# Patient Record
Sex: Female | Born: 1990 | Race: Black or African American | Hispanic: No | Marital: Single | Smoking: Never smoker
Health system: Southern US, Community
[De-identification: ages and names within clinical notes are randomized; demographics above are authoritative.]

## PROBLEM LIST (undated history)

## (undated) DIAGNOSIS — I10 Essential (primary) hypertension: Secondary | ICD-10-CM

## (undated) DIAGNOSIS — G43909 Migraine, unspecified, not intractable, without status migrainosus: Secondary | ICD-10-CM

## (undated) HISTORY — PX: ECTOPIC PREGNANCY SURGERY: SHX613

## (undated) HISTORY — PX: APPENDECTOMY: SHX54

---

## 2019-06-15 ENCOUNTER — Other Ambulatory Visit: Payer: Self-pay

## 2019-06-15 ENCOUNTER — Emergency Department (HOSPITAL_COMMUNITY)
Admission: EM | Admit: 2019-06-15 | Discharge: 2019-06-16 | Disposition: A | Discharge status: Home / Self Care | Payer: Self-pay | Attending: Emergency Medicine | Admitting: Emergency Medicine

## 2019-06-15 ENCOUNTER — Encounter (HOSPITAL_COMMUNITY): Payer: Self-pay | Admitting: Emergency Medicine

## 2019-06-15 DIAGNOSIS — R0602 Shortness of breath: Secondary | ICD-10-CM | POA: Insufficient documentation

## 2019-06-15 DIAGNOSIS — I10 Essential (primary) hypertension: Secondary | ICD-10-CM | POA: Insufficient documentation

## 2019-06-15 DIAGNOSIS — Z79899 Other long term (current) drug therapy: Secondary | ICD-10-CM | POA: Insufficient documentation

## 2019-06-15 DIAGNOSIS — R112 Nausea with vomiting, unspecified: Secondary | ICD-10-CM | POA: Insufficient documentation

## 2019-06-15 DIAGNOSIS — E860 Dehydration: Secondary | ICD-10-CM | POA: Insufficient documentation

## 2019-06-15 DIAGNOSIS — R109 Unspecified abdominal pain: Secondary | ICD-10-CM | POA: Insufficient documentation

## 2019-06-15 HISTORY — DX: Migraine, unspecified, not intractable, without status migrainosus: G43.909

## 2019-06-15 HISTORY — DX: Essential (primary) hypertension: I10

## 2019-06-15 NOTE — ED Triage Notes (Signed)
Pt reports traveling to [place] last weekend. Pt states on Tuesday she began to have emesis, diarrhea, and chills. Denies fevers at home.

## 2019-06-16 ENCOUNTER — Emergency Department (HOSPITAL_COMMUNITY): Payer: Self-pay

## 2019-06-16 LAB — COMPREHENSIVE METABOLIC PANEL
ALT: 17 U/L (ref 0–44)
AST: 17 U/L (ref 15–41)
Albumin: 4.2 g/dL (ref 3.5–5.0)
Alkaline Phosphatase: 45 U/L (ref 38–126)
Anion gap: 9 (ref 5–15)
BUN: 8 mg/dL (ref 6–20)
CO2: 25 mmol/L (ref 22–32)
Calcium: 9.3 mg/dL (ref 8.9–10.3)
Chloride: 105 mmol/L (ref 98–111)
Creatinine, Ser: 0.88 mg/dL (ref 0.44–1.00)
GFR calc Af Amer: >60 mL/min (ref >60)
GFR calc non Af Amer: >60 mL/min (ref >60)
Glucose, Bld: 89 mg/dL (ref 70–99)
Potassium: 3.8 mmol/L (ref 3.5–5.1)
Sodium: 139 mmol/L (ref 135–145)
Total Bilirubin: 0.5 mg/dL (ref 0.3–1.2)
Total Protein: 8.2 g/dL — ABNORMAL HIGH (ref 6.5–8.1)

## 2019-06-16 LAB — CBC WITH DIFFERENTIAL/PLATELET
Abs Immature Granulocytes: 0.01 10*3/uL (ref 0.00–0.07)
Basophils Absolute: 0.1 10*3/uL (ref 0.0–0.1)
Basophils Relative: 1 %
Eosinophils Absolute: 0.2 10*3/uL (ref 0.0–0.5)
Eosinophils Relative: 2 %
HCT: 43.6 % (ref 36.0–46.0)
Hemoglobin: 13.6 g/dL (ref 12.0–15.0)
Immature Granulocytes: 0 %
Lymphocytes Relative: 34 %
Lymphs Abs: 3.5 10*3/uL (ref 0.7–4.0)
MCH: 28.9 pg (ref 26.0–34.0)
MCHC: 31.2 g/dL (ref 30.0–36.0)
MCV: 92.6 fL (ref 80.0–100.0)
Monocytes Absolute: 0.7 10*3/uL (ref 0.1–1.0)
Monocytes Relative: 6 %
Neutro Abs: 6 10*3/uL (ref 1.7–7.7)
Neutrophils Relative %: 57 %
Platelets: 335 10*3/uL (ref 150–400)
RBC: 4.71 MIL/uL (ref 3.87–5.11)
RDW: 13.9 % (ref 11.5–15.5)
WBC: 10.4 10*3/uL (ref 4.0–10.5)
nRBC: 0 % (ref 0.0–0.2)

## 2019-06-16 LAB — D-DIMER, QUANTITATIVE: D-Dimer, Quant: 0.64 ug/mL-FEU — ABNORMAL HIGH (ref 0.00–0.50)

## 2019-06-16 LAB — LIPASE, BLOOD: Lipase: 28 U/L (ref 11–51)

## 2019-06-16 LAB — I-STAT BETA HCG BLOOD, ED (MC, WL, AP ONLY): I-stat hCG, quantitative: <5 m[IU]/mL (ref <5)

## 2019-06-16 LAB — LACTIC ACID, PLASMA: Lactic Acid, Venous: 1.1 mmol/L (ref 0.5–1.9)

## 2019-06-16 MED ORDER — ONDANSETRON HCL 4 MG PO TABS: 4.0000 mg | ORAL_TABLET | Freq: Three times a day (TID) | ORAL | 0 refills | Status: AC | PRN

## 2019-06-16 MED ORDER — DIPHENHYDRAMINE HCL 50 MG/ML IJ SOLN: 25.0000 mg | Freq: Once | INTRAMUSCULAR | Status: AC

## 2019-06-16 MED ORDER — IOHEXOL 350 MG/ML SOLN
100.0000 mL | Freq: Once | INTRAVENOUS | Status: AC | PRN
  Administered 2019-06-16: 100 mL via INTRAVENOUS

## 2019-06-16 MED ORDER — SODIUM CHLORIDE 0.9 % IV BOLUS
1000.0000 mL | Freq: Once | INTRAVENOUS | Status: AC
  Administered 2019-06-16: 1000 mL via INTRAVENOUS

## 2019-06-16 MED ORDER — FENTANYL CITRATE (PF) 100 MCG/2ML IJ SOLN
50.0000 ug | Freq: Once | INTRAMUSCULAR | Status: AC
  Administered 2019-06-16: 50 ug via INTRAVENOUS
  Filled 2019-06-16: qty 2

## 2019-06-16 MED ORDER — METOCLOPRAMIDE HCL 5 MG/ML IJ SOLN: 10.0000 mg | Freq: Once | INTRAMUSCULAR | Status: AC

## 2019-06-16 MED ORDER — LOPERAMIDE HCL 2 MG PO CAPS: 4.0000 mg | ORAL_CAPSULE | Freq: Once | ORAL | Status: AC

## 2019-06-16 MED ORDER — TRAMADOL HCL 50 MG PO TABS: 100.0000 mg | ORAL_TABLET | Freq: Four times a day (QID) | ORAL | 0 refills | Status: AC | PRN

## 2019-06-16 MED ORDER — LOPERAMIDE HCL 2 MG PO CAPS
ORAL_CAPSULE | ORAL | Status: AC
  Administered 2019-06-16: 4 mg via ORAL
  Filled 2019-06-16: qty 2

## 2019-06-16 MED ORDER — METOCLOPRAMIDE HCL 5 MG/ML IJ SOLN
INTRAMUSCULAR | Status: AC
  Administered 2019-06-16: 10 mg via INTRAVENOUS
  Filled 2019-06-16: qty 2

## 2019-06-16 MED ORDER — CIPROFLOXACIN HCL 500 MG PO TABS
500.0000 mg | ORAL_TABLET | Freq: Two times a day (BID) | ORAL | 0 refills | Status: AC
Start: 2019-06-16 — End: ?

## 2019-06-16 MED ORDER — IOHEXOL 300 MG/ML  SOLN
100.0000 mL | Freq: Once | INTRAMUSCULAR | Status: AC | PRN
  Administered 2019-06-16: 100 mL via INTRAVENOUS

## 2019-06-16 MED ORDER — ONDANSETRON HCL 4 MG/2ML IJ SOLN
4.0000 mg | Freq: Once | INTRAMUSCULAR | Status: AC
  Administered 2019-06-16: 4 mg via INTRAVENOUS
  Filled 2019-06-16: qty 2

## 2019-06-16 MED ORDER — METRONIDAZOLE 500 MG PO TABS: 500.0000 mg | ORAL_TABLET | Freq: Three times a day (TID) | ORAL | 0 refills | Status: AC

## 2019-06-16 MED ORDER — DIPHENHYDRAMINE HCL 50 MG/ML IJ SOLN
INTRAMUSCULAR | Status: AC
  Administered 2019-06-16: 25 mg via INTRAVENOUS
  Filled 2019-06-16: qty 1

## 2019-06-16 MED ORDER — METRONIDAZOLE IN NACL 5-0.79 MG/ML-% IV SOLN
500.0000 mg | Freq: Once | INTRAVENOUS | Status: AC
  Administered 2019-06-16: 500 mg via INTRAVENOUS
  Filled 2019-06-16: qty 100

## 2019-06-16 MED ORDER — ONDANSETRON HCL 4 MG PO TABS: 4.0000 mg | ORAL_TABLET | Freq: Four times a day (QID) | ORAL | 0 refills | Status: AC

## 2019-06-16 MED ORDER — CIPROFLOXACIN IN D5W 400 MG/200ML IV SOLN
400.0000 mg | Freq: Once | INTRAVENOUS | Status: AC
  Administered 2019-06-16: 400 mg via INTRAVENOUS
  Filled 2019-06-16: qty 200

## 2019-06-16 NOTE — Discharge Instructions (Addendum)
Drink plenty of fluids (clear liquids) then start a bland diet later this morning such as toast, crackers, jello, [employer] chicken noodle soup. Use the zofran for nausea or vomiting. Take imodium OTC for diarrhea. Avoid milk products until the diarrhea is gone.  Take the tramadol with acetaminophen 650 mg 4 times a day for pain.  Take the antibiotics until gone.  Recheck if you get worse again.

## 2019-06-16 NOTE — ED Provider Notes (Signed)
[facility] EMERGENCY DEPARTMENT Provider Note   CSN: [id number] Arrival date & time: 06/15/19  2310   Time seen 12:15 AM  History Chief Complaint  Patient presents with  . Emesis    Cathy Carr is a 29 y.o. female.  HPI   Patient states she moved here from [place] about 1 month ago.  They went back over the weekend, June 4 and returned on Monday evening June 7.  She states they drove.  She states June 8 she started getting nausea and vomiting and diarrhea.  She states she is vomiting about 3-4 times a day and has been without bleeding until tonight about 45 minutes prior to arrival where she saw some streaks of blood.  She describes a lot of dry heaving.  She states she is having 4-5 episodes of watery diarrhea.  She has diffuse abdominal pain that gets worse right before she has to vomit or have diarrhea.  She complains of myalgias and chills but has had no known fever.  She feels dizzy and lightheaded on standing since this morning.  She denies having a dry tongue.  She states her chest feels tight and she feels short of breath. She denies history of diabetes.  She states she is never felt this way before.  She states when she called home she found out her grandmother, her aunt, and her son are also sick.  Her fianc and older daughter are not sick.  Patient states she had the Moderna vaccine in April.  PCP Patient, No Pcp Per   Past Medical History:  Diagnosis Date  . Hypertension   . Migraine     There are no problems to display for this patient.   Past Surgical History:  Procedure Laterality Date  . APPENDECTOMY    . ECTOPIC PREGNANCY SURGERY       OB History   No obstetric history on file.     No family history on file.  Social History   Tobacco Use  . Smoking status: Never Smoker  . Smokeless tobacco: Never Used  Substance Use Topics  . Alcohol use: Not Currently  . Drug use: Not Currently    Home Medications Prior to Admission medications     Medication Sig Start Date End Date Taking? Authorizing Provider  ciprofloxacin (CIPRO) 500 MG tablet Take 1 tablet (500 mg total) by mouth 2 (two) times daily. 06/16/19   Devoria Albe, MD  metroNIDAZOLE (FLAGYL) 500 MG tablet Take 1 tablet (500 mg total) by mouth 3 (three) times daily. 06/16/19   Devoria Albe, MD  ondansetron (ZOFRAN) 4 MG tablet Take 1 tablet (4 mg total) by mouth every 8 (eight) hours as needed. 06/16/19   Devoria Albe, MD  ondansetron (ZOFRAN) 4 MG tablet Take 1 tablet (4 mg total) by mouth every 6 (six) hours. 06/16/19   Devoria Albe, MD  traMADol (ULTRAM) 50 MG tablet Take 2 tablets (100 mg total) by mouth every 6 (six) hours as needed for moderate pain or severe pain. 06/16/19   Devoria Albe, MD    Allergies    Patient has no allergy information on record.  Review of Systems   Review of Systems  All other systems reviewed and are negative.   Physical Exam Updated Vital Signs BP 124/71 (BP Location: Right Arm)   Pulse 77   Temp 98.6 F (37 C) (Oral)   Resp 15   Ht 5\' 2"  (1.575 m)   Wt 79.4 kg   SpO2 100%  BMI 32.01 kg/m   Physical Exam Vitals and nursing note reviewed.  Constitutional:      General: She is in acute distress.     Appearance: Normal appearance. She is normal weight.  HENT:     Head: Normocephalic and atraumatic.     Right Ear: External ear normal.     Left Ear: External ear normal.     Nose: Nose normal.     Mouth/Throat:     Mouth: Mucous membranes are dry.  Eyes:     Extraocular Movements: Extraocular movements intact.     Conjunctiva/sclera: Conjunctivae normal.     Pupils: Pupils are equal, round, and reactive to light.  Cardiovascular:     Rate and Rhythm: Normal rate and regular rhythm.  Pulmonary:     Effort: Respiratory distress present.     Breath sounds: Normal breath sounds. No stridor. No wheezing, rhonchi or rales.  Abdominal:     General: Bowel sounds are normal.     Palpations: Abdomen is soft.     Tenderness: There is  abdominal tenderness.     Comments: Patient's tenderness appears to be mainly on the left side and the worst is in the left lower quadrant followed by the left upper quadrant.  Musculoskeletal:        General: Normal range of motion.     Cervical back: Normal range of motion.  Skin:    General: Skin is warm and dry.  Neurological:     General: No focal deficit present.     Mental Status: She is alert and oriented to person, place, and time.     Cranial Nerves: No cranial nerve deficit.  Psychiatric:        Mood and Affect: Mood is anxious.        Speech: Speech is rapid and pressured.        Behavior: Behavior is cooperative.     ED Results / Procedures / Treatments   Labs (all labs ordered are listed, but only abnormal results are displayed) Results for orders placed or performed during the hospital encounter of 06/15/19  Comprehensive metabolic panel  Result Value Ref Range   Sodium 139 135 - 145 mmol/L   Potassium 3.8 3.5 - 5.1 mmol/L   Chloride 105 98 - 111 mmol/L   CO2 25 22 - 32 mmol/L   Glucose, Bld 89 70 - 99 mg/dL   BUN 8 6 - 20 mg/dL   Creatinine, Ser 7.820.88 0.44 - 1.00 mg/dL   Calcium 9.3 8.9 - 95.610.3 mg/dL   Total Protein 8.2 (H) 6.5 - 8.1 g/dL   Albumin 4.2 3.5 - 5.0 g/dL   AST 17 15 - 41 U/L   ALT 17 0 - 44 U/L   Alkaline Phosphatase 45 38 - 126 U/L   Total Bilirubin 0.5 0.3 - 1.2 mg/dL   GFR calc non Af Amer >60 >60 mL/min   GFR calc Af Amer >60 >60 mL/min   Anion gap 9 5 - 15  Lipase, blood  Result Value Ref Range   Lipase 28 11 - 51 U/L  CBC with Differential  Result Value Ref Range   WBC 10.4 4.0 - 10.5 K/uL   RBC 4.71 3.87 - 5.11 MIL/uL   Hemoglobin 13.6 12.0 - 15.0 g/dL   HCT 21.343.6 36 - 46 %   MCV 92.6 80.0 - 100.0 fL   MCH 28.9 26.0 - 34.0 pg   MCHC 31.2 30.0 - 36.0 g/dL   RDW  13.9 11.5 - 15.5 %   Platelets 335 150 - 400 K/uL   nRBC 0.0 0.0 - 0.2 %   Neutrophils Relative % 57 %   Neutro Abs 6.0 1.7 - 7.7 K/uL   Lymphocytes Relative 34 %    Lymphs Abs 3.5 0.7 - 4.0 K/uL   Monocytes Relative 6 %   Monocytes Absolute 0.7 0 - 1 K/uL   Eosinophils Relative 2 %   Eosinophils Absolute 0.2 0 - 0 K/uL   Basophils Relative 1 %   Basophils Absolute 0.1 0 - 0 K/uL   Immature Granulocytes 0 %   Abs Immature Granulocytes 0.01 0.00 - 0.07 K/uL  Lactic acid, plasma  Result Value Ref Range   Lactic Acid, Venous 1.1 0.5 - 1.9 mmol/L  D-dimer, quantitative  Result Value Ref Range   D-Dimer, Quant 0.64 (H) 0.00 - 0.50 ug/mL-FEU  I-Stat Beta hCG blood, ED ([facility], [facility], [facility] only)  Result Value Ref Range   I-stat hCG, quantitative <5.0 <5 mIU/mL   Comment 3           Laboratory interpretation all normal except mildly elevated D-dimer    EKG None  Radiology CT Angio Chest PE W/Cm &/Or Wo Cm  Result Date: 06/16/2019 CLINICAL DATA:  Shortness of breath, elevated D-dimer, recent prolonged travel EXAM: CT ANGIOGRAPHY CHEST WITH CONTRAST TECHNIQUE: Multidetector CT imaging of the chest was performed using the standard protocol during bolus administration of intravenous contrast. Multiplanar CT image reconstructions and MIPs were obtained to evaluate the vascular anatomy. CONTRAST:  144mL OMNIPAQUE IOHEXOL 300 MG/ML SOLN, 170mL OMNIPAQUE IOHEXOL 350 MG/ML SOLN COMPARISON:  Same-day CT abdomen and pelvis. FINDINGS: Cardiovascular: Satisfactory opacification the pulmonary arteries to the segmental level. No pulmonary artery filling defects are identified. Central pulmonary arteries are normal caliber. Mild reflux of contrast into the IVC and hepatic veins. Normal heart size. No pericardial effusion. The aortic root is suboptimally assessed given cardiac pulsation artifact. The aorta is normal caliber. No acute luminal abnormality, periaortic stranding or hemorrhage. Shared origin of the brachiocephalic and left common carotid artery. Great vessels are otherwise unremarkable. Mediastinum/Nodes: Wedge-shaped soft tissue attenuation in the anterior mediastinum.  No mediastinal fluid or gas. Normal thyroid gland and thoracic inlet. No acute abnormality of the trachea or esophagus. No worrisome mediastinal, hilar or axillary adenopathy. Lungs/Pleura: Low volumes and atelectatic changes likely accentuated by imaging during exhalation angiographic technique. No consolidation, features of edema, pneumothorax, or effusion. No suspicious pulmonary nodules or masses. Upper Abdomen: Punctate gallstone within the otherwise normal gallbladder. Scattered colonic diverticula without focal pericolonic inflammation to suggest diverticulitis. No other acute abnormalities present in the visualized portions of the upper abdomen. Musculoskeletal: No chest wall abnormality. No acute or significant osseous findings. Review of the MIP images confirms the above findings. IMPRESSION: 1. No acute pulmonary artery filling defects to suggest pulmonary embolism. 2. Reflux of contrast in the IVC and hepatic veins is nonspecific but can be seen with elevated right heart pressures. 3. Low volumes and atelectatic changes likely accentuated by imaging during exhalation angiographic technique. No other acute intrathoracic process. 4. Wedge-shaped soft tissue attenuation in the anterior mediastinum, likely representing a thymic remnant in a patient of this age. 5. Cholelithiasis without evidence of acute cholecystitis. 6. Colonic diverticulosis without evidence of acute diverticulitis. Electronically Signed   By: Lovena Le M.D.   On: 06/16/2019 05:51   CT Abdomen Pelvis W Contrast  Result Date: 06/16/2019 CLINICAL DATA:  Diverticulitis suspected EXAM: CT ABDOMEN AND  PELVIS WITH CONTRAST TECHNIQUE: Multidetector CT imaging of the abdomen and pelvis was performed using the standard protocol following bolus administration of intravenous contrast. CONTRAST:  OMNIPAQUE IOHEXOL 300 MG/ML  SOLN COMPARISON:  None. FINDINGS: Lower chest: Lung bases are clear. Normal heart size. No pericardial effusion.  Hepatobiliary: No worrisome focal liver lesions. Smooth liver surface contour. Normal hepatic attenuation. Small punctate gallstone within the gallbladder. No pericholecystic fluid or inflammation. No calcified intraductal gallstones or biliary dilatation. Pancreas: Unremarkable. No pancreatic ductal dilatation or surrounding inflammatory changes. Spleen: Normal in size without focal abnormality. Adrenals/Urinary Tract: Normal adrenal glands. Kidneys are normally located with symmetric enhancement and excretion. No suspicious renal lesion, urolithiasis or hydronephrosis. Urinary bladder is unremarkable. Stomach/Bowel: Distal esophagus, stomach and duodenal sweep are unremarkable. No small bowel wall thickening or dilatation. No evidence of obstruction. The appendix is surgically absent. There may be mild descending colonic mural thickening versus underdistention. Scattered colonic diverticula without focal pericolonic inflammation to suggest diverticulitis. Vascular/Lymphatic: No significant vascular findings are present. No enlarged abdominal or pelvic lymph nodes. Reproductive: Anteverted uterus.  No concerning adnexal lesions. Other: No abdominopelvic free fluid or free gas. No bowel containing hernias. Musculoskeletal: No acute osseous abnormality or suspicious osseous lesion. IMPRESSION: 1. Mild descending colonic mural thickening versus underdistention. Correlate for symptoms of colitis. 2. Colonic diverticulosis without convincing features of diverticulitis. 3. Cholelithiasis without evidence of acute cholecystitis. 4. Prior appendectomy. Electronically Signed   By: Kreg Shropshire M.D.   On: 06/16/2019 03:06    Procedures Procedures (including critical care time)  Medications Ordered in ED Medications  sodium chloride 0.9 % bolus 1,000 mL (0 mLs Intravenous Stopped 06/16/19 0211)  ondansetron (ZOFRAN) injection 4 mg (4 mg Intravenous Given 06/16/19 0035)  fentaNYL (SUBLIMAZE) injection 50 mcg (50 mcg  Intravenous Given 06/16/19 0035)  diphenhydrAMINE (BENADRYL) injection 25 mg (25 mg Intravenous Given 06/16/19 0212)  metoCLOPramide (REGLAN) injection 10 mg (10 mg Intravenous Given 06/16/19 0212)  loperamide (IMODIUM) capsule 4 mg (4 mg Oral Given 06/16/19 0213)  sodium chloride 0.9 % bolus 1,000 mL (0 mLs Intravenous Stopped 06/16/19 0406)  iohexol (OMNIPAQUE) 300 MG/ML solution 100 mL (100 mLs Intravenous Contrast Given 06/16/19 0234)  metroNIDAZOLE (FLAGYL) IVPB 500 mg (0 mg Intravenous Stopped 06/16/19 4098)  ciprofloxacin (CIPRO) IVPB 400 mg (0 mg Intravenous Stopped 06/16/19 0609)  iohexol (OMNIPAQUE) 350 MG/ML injection 100 mL (100 mLs Intravenous Contrast Given 06/16/19 0510)    ED Course  I have reviewed the triage vital signs and the nursing notes.  Pertinent labs & imaging results that were available during my care of the patient were reviewed by me and considered in my medical decision making (see chart for details).    MDM Rules/Calculators/A&P                        01:20 AM still seems stressed, now c/o HA after fentanyl "rush", states had same when delivered her baby. Given bendaryl 25 mg IV, reglan 10 mg IV, Imodium 4 mg po and another liter NS bolus  2:00 AM when I went to see patient she states she is feeling worse, when asked her what is wrong she states she is having a headache.  She states that they "post fentanyl head rush".  She states she had it before when she was given medication when she was delivering her baby.  She was given Reglan and Benadryl.  Recheck at 4:00 AM patient states she has had very minimal  diarrhea since she got medication in the ED.  She states she still feels short of breath but it is better.  We discussed that her D-dimer was minimally elevated and with her recent travel concern is for PE.  CTA was ordered.  We also discussed her CT abdomen results which did show gallstones and diverticulosis.  Possibly some colitis.  She was started on Cipro and  Flagyl.  6:20 AM patient was given results of her CT scan which was negative for pulmonary embolus or infiltrate.  Patient states she has had minimal diarrhea since she has been here and her nausea is better.  She states she still feels short of breath.  At this point I am not sure the etiology of her shortness of breath but her scans are reassuring.  I am not convinced she has colitis however I will discharge her home with Flagyl and Cipro in case she does have some mild inflammation of her colon.  Final Clinical Impression(s) / ED Diagnoses Final diagnoses:  Nausea vomiting and diarrhea  Dehydration  Shortness of breath    Rx / DC Orders ED Discharge Orders         Ordered    ciprofloxacin (CIPRO) 500 MG tablet  2 times daily     Discontinue  Reprint     06/16/19 0629    metroNIDAZOLE (FLAGYL) 500 MG tablet  3 times daily     Discontinue  Reprint     06/16/19 0629    ondansetron (ZOFRAN) 4 MG tablet  Every 8 hours PRN     Discontinue  Reprint     06/16/19 0629    ondansetron (ZOFRAN) 4 MG tablet  Every 6 hours     Discontinue  Reprint     06/16/19 0629    traMADol (ULTRAM) 50 MG tablet  Every 6 hours PRN     Discontinue  Reprint     06/16/19 0629         Plan discharge  Devoria Albe, MD, Concha Pyo, MD 06/16/19 0630

## 2019-08-16 ENCOUNTER — Other Ambulatory Visit (HOSPITAL_COMMUNITY)
Admission: RE | Admit: 2019-08-16 | Discharge: 2019-08-16 | Disposition: A | Discharge status: Home / Self Care | Payer: Self-pay | Source: Ambulatory Visit | Attending: Obstetrics & Gynecology | Admitting: Obstetrics & Gynecology

## 2019-08-16 DIAGNOSIS — Z01419 Encounter for gynecological examination (general) (routine) without abnormal findings: Secondary | ICD-10-CM | POA: Insufficient documentation

## 2020-04-06 DIAGNOSIS — Z419 Encounter for procedure for purposes other than remedying health state, unspecified: Secondary | ICD-10-CM | POA: Diagnosis not present

## 2020-05-06 DIAGNOSIS — Z419 Encounter for procedure for purposes other than remedying health state, unspecified: Secondary | ICD-10-CM | POA: Diagnosis not present

## 2020-06-06 DIAGNOSIS — Z419 Encounter for procedure for purposes other than remedying health state, unspecified: Secondary | ICD-10-CM | POA: Diagnosis not present

## 2020-07-06 DIAGNOSIS — Z419 Encounter for procedure for purposes other than remedying health state, unspecified: Secondary | ICD-10-CM | POA: Diagnosis not present

## 2020-12-24 IMAGING — CT CT ABD-PELV W/ CM
2 of 4 series · 16 of 46 positions shown, 18 images · IV contrast (Omnipaque or Isovue)
Comparison: None.

CLINICAL DATA: Diverticulitis suspected

EXAM:
CT ABDOMEN AND PELVIS WITH CONTRAST
TECHNIQUE: Multidetector CT imaging of the abdomen and pelvis was performed
using the standard protocol following bolus administration of
intravenous contrast.
CONTRAST:  100mL OMNIPAQUE IOHEXOL 300 MG/ML  SOLN

[Series 2: axial st · axial · 0.81mm/px · z∈[+599,+1034]mm · 13 of 95 slices shown, 15 images]
[im 4/95  soft-tissue]
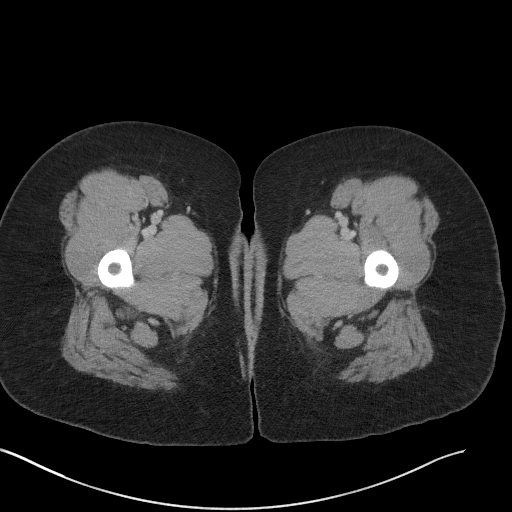
[im 4/95  bone]
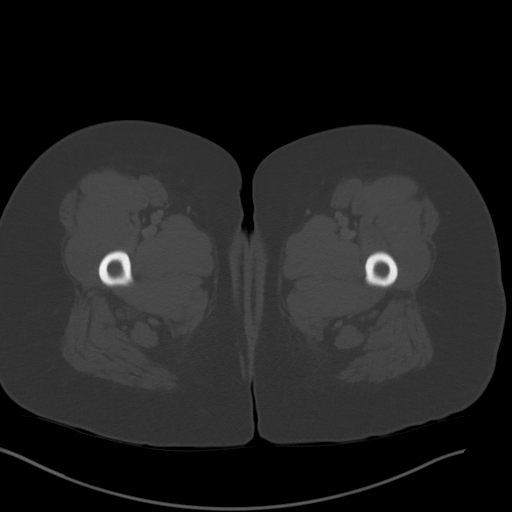
[im 12/95  soft-tissue]
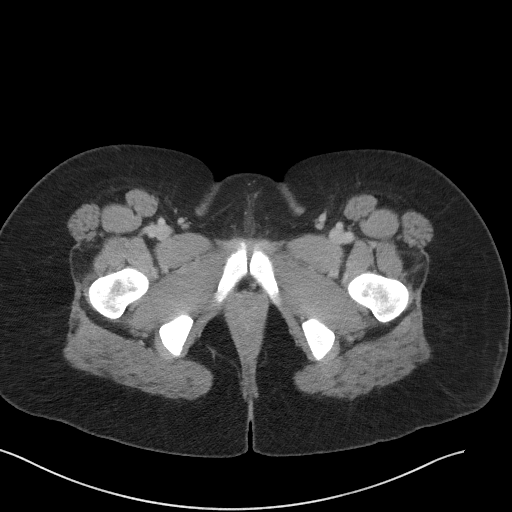
[im 19/95  soft-tissue]
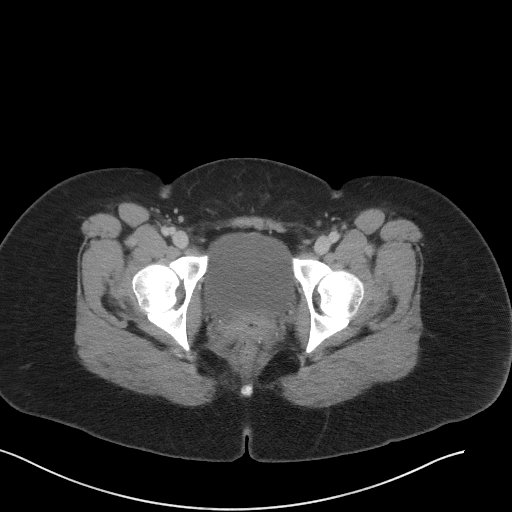
[im 27/95  soft-tissue]
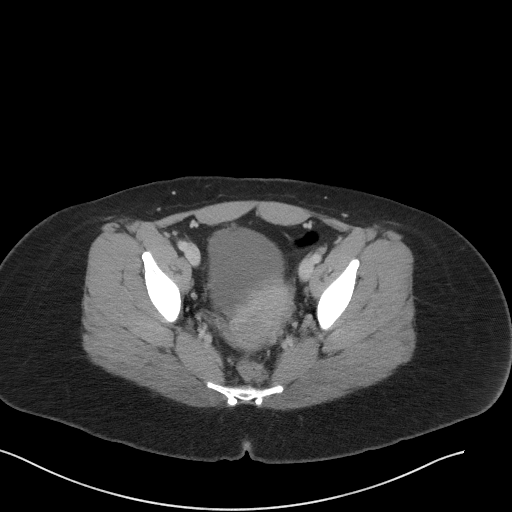
[im 34/95  soft-tissue]
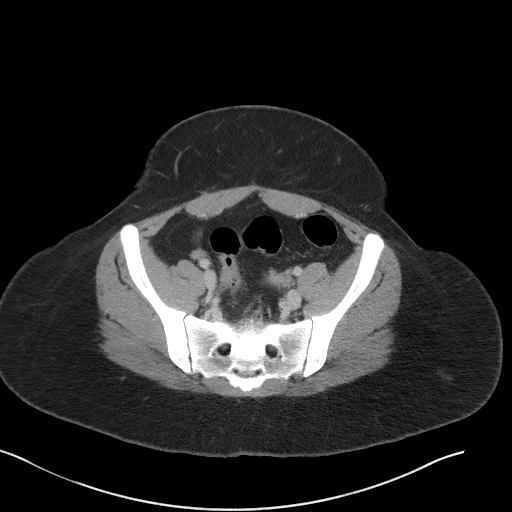
[im 42/95  soft-tissue]
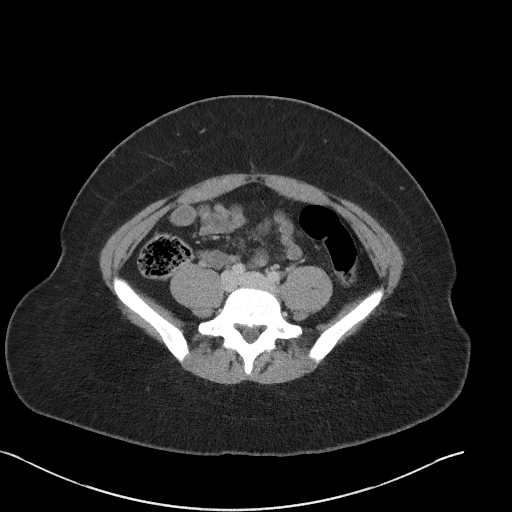
[im 49/95  soft-tissue]
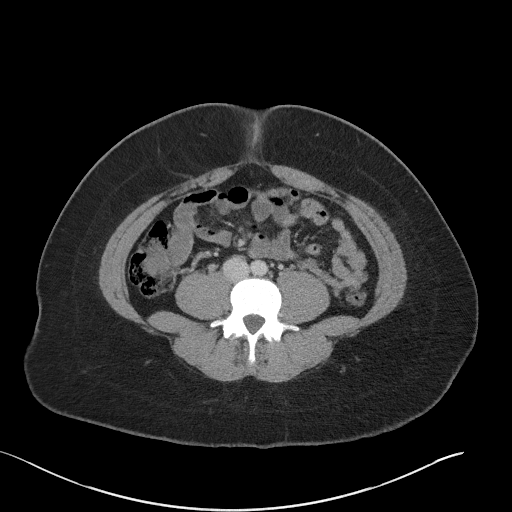
[im 53/95  soft-tissue]
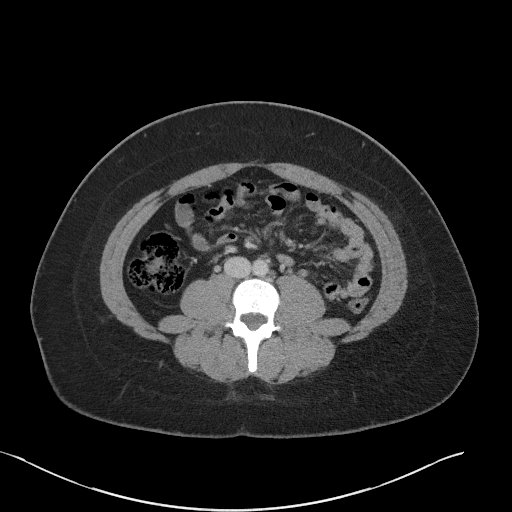
[im 61/95  soft-tissue]
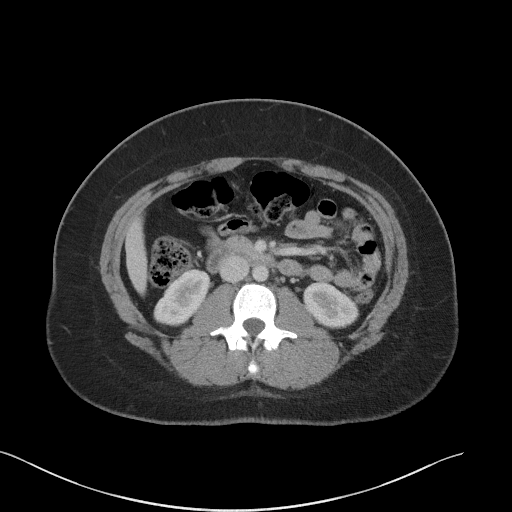
[im 61/95  bone]
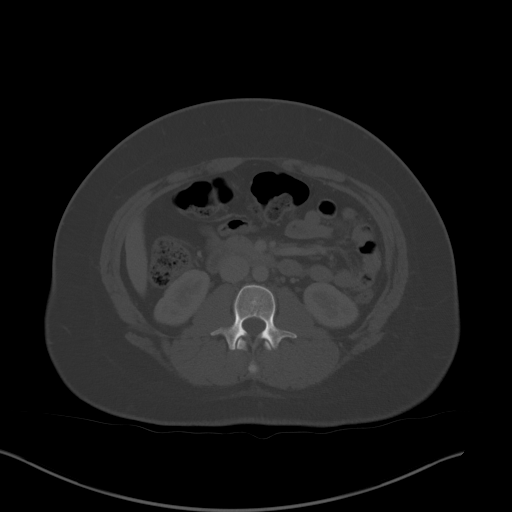
[im 68/95  soft-tissue]
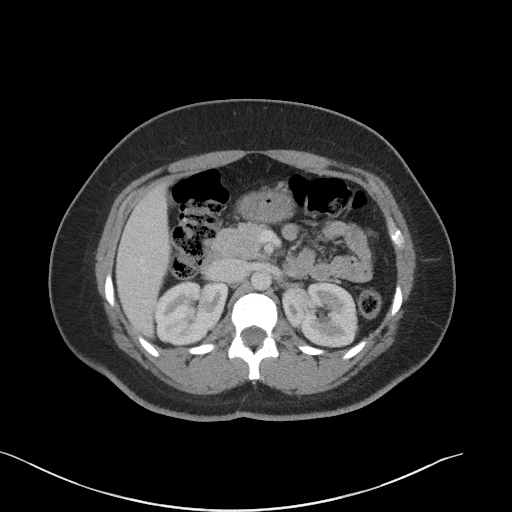
[im 76/95  soft-tissue]
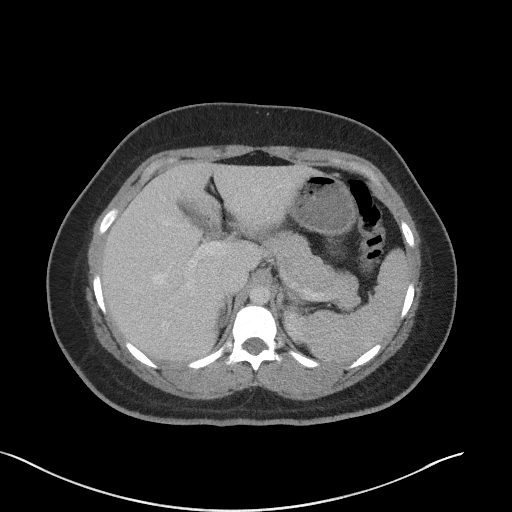
[im 83/95  soft-tissue]
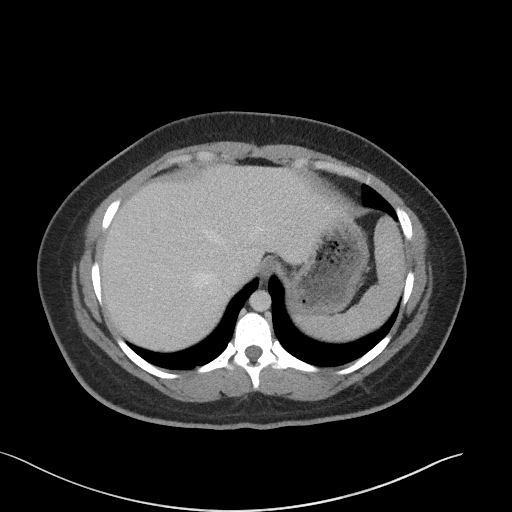
[im 91/95  soft-tissue]
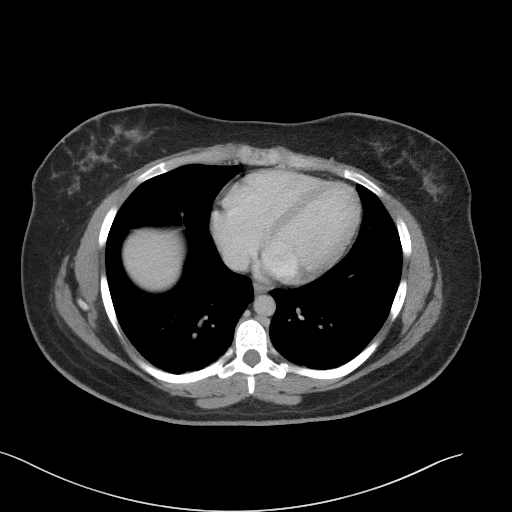

[Series 5: coronal st · coronal · 0.82mm/px · 3 of 116 slices shown]
[im 39/116  soft-tissue]
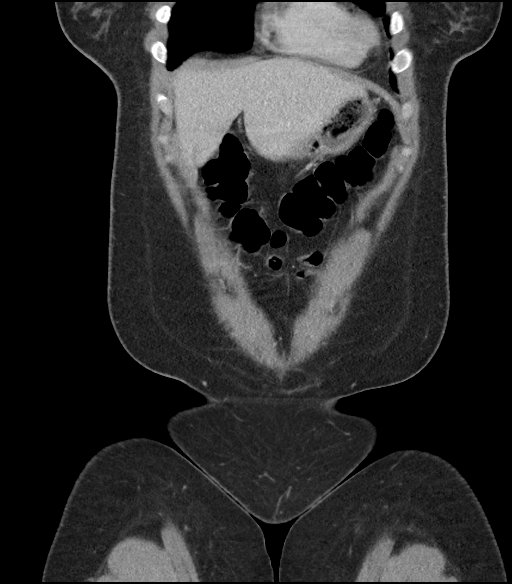
[im 52/116  soft-tissue]
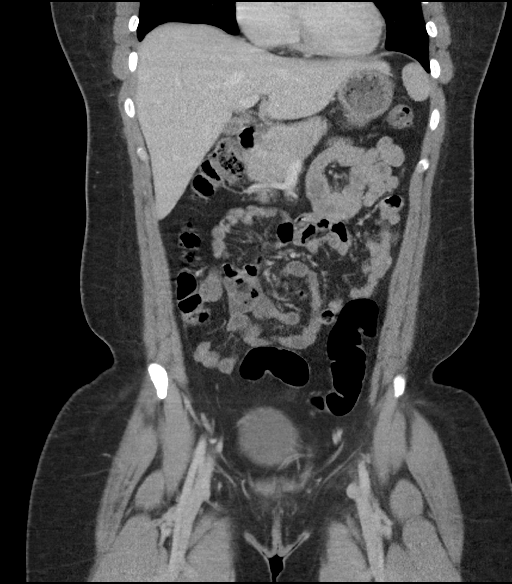
[im 64/116  soft-tissue]
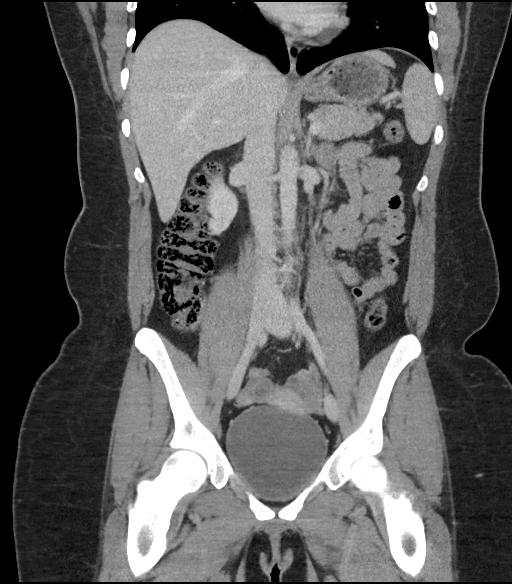

[16 of 46 positions shown; findings below may reference images not displayed]

FINDINGS: Lower chest: Lung bases are clear. Normal heart size. No pericardial
effusion.

Hepatobiliary: No worrisome focal liver lesions. Smooth liver
surface contour. Normal hepatic attenuation. Small punctate
gallstone within the gallbladder. No pericholecystic fluid or
inflammation. No calcified intraductal gallstones or biliary
dilatation.

Pancreas: Unremarkable. No pancreatic ductal dilatation or
surrounding inflammatory changes.

Spleen: Normal in size without focal abnormality.

Adrenals/Urinary Tract: Normal adrenal glands. Kidneys are normally
located with symmetric enhancement and excretion. No suspicious
renal lesion, urolithiasis or hydronephrosis. Urinary bladder is
unremarkable.

Stomach/Bowel: Distal esophagus, stomach and duodenal sweep are
unremarkable. No small bowel wall thickening or dilatation. No
evidence of obstruction. The appendix is surgically absent. There
may be mild descending colonic mural thickening versus
underdistention. Scattered colonic diverticula without focal
pericolonic inflammation to suggest diverticulitis.

Vascular/Lymphatic: No significant vascular findings are present. No
enlarged abdominal or pelvic lymph nodes.

Reproductive: Anteverted uterus.  No concerning adnexal lesions.

Other: No abdominopelvic free fluid or free gas. No bowel containing
hernias.

Musculoskeletal: No acute osseous abnormality or suspicious osseous
lesion.
IMPRESSION: 1. Mild descending colonic mural thickening versus underdistention.
Correlate for symptoms of colitis.
2. Colonic diverticulosis without convincing features of
diverticulitis.
3. Cholelithiasis without evidence of acute cholecystitis.
4. Prior appendectomy.
